# Patient Record
Sex: Male | Born: 1980 | Race: Black or African American | Hispanic: No | Marital: Married | Smoking: Current every day smoker
Health system: Southern US, Community
[De-identification: ages and names within clinical notes are randomized; demographics above are authoritative.]

## PROBLEM LIST (undated history)

## (undated) DIAGNOSIS — J45909 Unspecified asthma, uncomplicated: Secondary | ICD-10-CM

---
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
## 2005-05-31 ENCOUNTER — Emergency Department (HOSPITAL_COMMUNITY): Admission: EM | Admit: 2005-05-31 | Discharge: 2005-05-31 | Payer: Self-pay | Admitting: Family Medicine

## 2011-11-24 ENCOUNTER — Emergency Department (INDEPENDENT_AMBULATORY_CARE_PROVIDER_SITE_OTHER)
Admission: EM | Admit: 2011-11-24 | Discharge: 2011-11-24 | Disposition: A | Discharge status: Home / Self Care | Payer: BC Managed Care – PPO | Source: Home / Self Care | Attending: Family Medicine | Admitting: Family Medicine

## 2011-11-24 ENCOUNTER — Encounter (HOSPITAL_COMMUNITY): Payer: Self-pay

## 2011-11-24 ENCOUNTER — Emergency Department (INDEPENDENT_AMBULATORY_CARE_PROVIDER_SITE_OTHER): Payer: BC Managed Care – PPO

## 2011-11-24 DIAGNOSIS — S6390XA Sprain of unspecified part of unspecified wrist and hand, initial encounter: Secondary | ICD-10-CM

## 2011-11-24 DIAGNOSIS — IMO0001 Reserved for inherently not codable concepts without codable children: Secondary | ICD-10-CM

## 2011-11-24 NOTE — ED Provider Notes (Signed)
History     CSN: [id number]  Arrival date & time 11/24/11  1206   First MD Initiated Contact with Patient 11/24/11 1232      Chief Complaint  Patient presents with  . Finger Injury    (Consider location/radiation/quality/duration/timing/severity/associated sxs/prior treatment) Patient is a 31 y.o. male presenting with hand pain. The history is provided by the patient.  Hand Pain This is a new problem. Episode onset: rrf struck with basketball on sun, thinks was dislocated , self reduced, still sore and swollen. The problem has not changed since onset.   History reviewed. No pertinent past medical history.  History reviewed. No pertinent past surgical history.  History reviewed. No pertinent family history.  History  Substance Use Topics  . Smoking status: Not on file  . Smokeless tobacco: Not on file  . Alcohol Use: Not on file      Review of Systems  Constitutional: Negative.   Musculoskeletal: Positive for joint swelling.    Allergies  Review of patient's allergies indicates no known allergies.  Home Medications  No current outpatient prescriptions on file.  BP 131/72  Pulse 67  Temp(Src) 99 F (37.2 C) (Oral)  Resp 16  SpO2 100%  Physical Exam  Nursing note and vitals reviewed. Constitutional: He is oriented to person, place, and time. He appears well-developed and well-nourished.  Musculoskeletal: He exhibits edema and tenderness.       Hands: Neurological: He is alert and oriented to person, place, and time.    ED Course  Procedures (including critical care time)  Labs Reviewed - No data to display Dg Finger Ring Right  11/24/2011  *RADIOLOGY REPORT*  Clinical Data: 31 year old male with blunt trauma.  Pain.  RIGHT RING FINGER 2+V  Comparison: None.  Findings: Bone mineralization is within normal limits.  Joint spaces are within normal limits.  Alignment preserved.  No acute fracture.  IMPRESSION: No acute fracture or dislocation identified about  the right fourth finger.  Original Report Authenticated By: Harley Hallmark, M.D.     1. Sprain of fourth finger of right hand       MDM  X-rays reviewed and report per radiologist.         Linna Hoff, MD 11/29/11 2045

## 2011-11-24 NOTE — ED Notes (Signed)
I think I dislocated my finger on Sunday; c/o pain right ring finger, swelling decreased ROM DIP segment, skin intact, no significant deformity

## 2011-11-24 NOTE — Discharge Instructions (Signed)
Tape fingers for comfort as needed, see orthopedist if further problems.

## 2013-11-13 ENCOUNTER — Emergency Department (INDEPENDENT_AMBULATORY_CARE_PROVIDER_SITE_OTHER): Payer: Self-pay

## 2013-11-13 ENCOUNTER — Emergency Department (INDEPENDENT_AMBULATORY_CARE_PROVIDER_SITE_OTHER)
Admission: EM | Admit: 2013-11-13 | Discharge: 2013-11-13 | Disposition: A | Discharge status: Home / Self Care | Payer: Self-pay | Source: Home / Self Care | Attending: Emergency Medicine | Admitting: Emergency Medicine

## 2013-11-13 ENCOUNTER — Encounter (HOSPITAL_COMMUNITY): Payer: Self-pay | Admitting: Emergency Medicine

## 2013-11-13 DIAGNOSIS — X500XXA Overexertion from strenuous movement or load, initial encounter: Secondary | ICD-10-CM

## 2013-11-13 DIAGNOSIS — S93409A Sprain of unspecified ligament of unspecified ankle, initial encounter: Secondary | ICD-10-CM

## 2013-11-13 DIAGNOSIS — Y9367 Activity, basketball: Secondary | ICD-10-CM

## 2013-11-13 MED ORDER — IBUPROFEN 800 MG PO TABS
800.0000 mg | ORAL_TABLET | Freq: Once | ORAL | Status: AC
  Administered 2013-11-13: 800 mg via ORAL

## 2013-11-13 MED ORDER — IBUPROFEN 800 MG PO TABS
ORAL_TABLET | ORAL | Status: AC
  Filled 2013-11-13: qty 1

## 2013-11-13 NOTE — ED Notes (Signed)
Pt  Reports  He  Injured  His  l  Ankle  Yesterday  Playing  Basketball     He  Has    Pain /  Swelling   To the  Affected  Ankle      Pt  denys  Any other  injurys

## 2013-11-13 NOTE — ED Provider Notes (Signed)
Medical screening examination/treatment/procedure(s) were performed by non-physician practitioner and as supervising physician I was immediately available for consultation/collaboration.  Leslee Homeavid Niva Murren, M.D.  Reuben Likesavid C Gemini Beaumier, MD 11/13/13 1440

## 2013-11-13 NOTE — ED Provider Notes (Signed)
CSN: [id number]     Arrival date & time 11/13/13  [id number] History   First MD Initiated Contact with Patient 11/13/13 [phone]     Chief Complaint  Patient presents with  . Ankle Pain   (Consider location/radiation/quality/duration/timing/severity/associated sxs/prior Treatment) Patient is a 33 y.o. male presenting with ankle pain. The history is provided by the patient.  Ankle Pain Location:  Ankle Time since incident:  1 day Injury: yes   Mechanism of injury comment:  Twisted ankle while playing basketball last night Ankle location:  L ankle Pain details:    Quality:  Aching   Radiates to:  Does not radiate   Severity:  Mild Dislocation: no   Foreign body present:  No foreign bodies Prior injury to area:  No   History reviewed. No pertinent past medical history. History reviewed. No pertinent past surgical history. History reviewed. No pertinent family history. History  Substance Use Topics  . Smoking status: Not on file  . Smokeless tobacco: Not on file  . Alcohol Use: No    Review of Systems  All other systems reviewed and are negative.   Allergies  Review of patient's allergies indicates no known allergies.  Home Medications  No current outpatient prescriptions on file. BP 129/81  Pulse 60  Temp(Src) 97.3 F (36.3 C) (Oral)  Resp 16  SpO2 100% Physical Exam  Nursing note and vitals reviewed. Constitutional: He is oriented to person, place, and time. He appears well-developed and well-nourished. No distress.  HENT:  Head: Normocephalic and atraumatic.  Eyes: Conjunctivae are normal.  Cardiovascular: Normal rate.   Pulmonary/Chest: Effort normal.  Musculoskeletal: Normal range of motion.       Left ankle: He exhibits normal range of motion, no swelling, no ecchymosis, no deformity, no laceration and normal pulse. Tenderness. Lateral malleolus tenderness found. No medial malleolus tenderness found. Achilles tendon normal.  Neurological: He is alert and oriented to  person, place, and time.  Skin: Skin is warm and dry.  +intact   Psychiatric: He has a normal mood and affect. His behavior is normal.    ED Course  Procedures (including critical care time) Labs Review Labs Reviewed - No data to display Imaging Review Dg Ankle Complete Left  11/13/2013   CLINICAL DATA:  Left ankle injury.  EXAM: LEFT ANKLE COMPLETE - 3+ VIEW  COMPARISON:  None.  FINDINGS: There is no evidence of fracture, dislocation, or joint effusion. There is no evidence of arthropathy or other focal bone abnormality. Soft tissues are unremarkable.  IMPRESSION: Normal left ankle.   Electronically Signed   By: Roque LiasJames  Green M.D.   On: 11/13/2013 10:24     MDM   1. Ankle sprain   Films negative for fx. Exam consistent with mild ankle sprain. RICE, NSAIDs, ASO and crutches. Ortho follow up if no improvement over next 2-3 weeks.     Jess BartersJennifer Lee WallsPresson, GeorgiaPA 11/13/13 1055

## 2013-11-13 NOTE — Discharge Instructions (Signed)
Your xrays were normal. Splint and crutches as needed for comfort. Ice and elevate to reduce swelling. Ibuprofen as directed on packaging for pain. Follow up with orthopedist (Dr. Luiz Blare) if no improvement over the next 2-3 weeks.  Ankle Sprain An ankle sprain is an injury to the strong, fibrous tissues (ligaments) that hold the bones of your ankle joint together.  CAUSES An ankle sprain is usually caused by a fall or by twisting your ankle. Ankle sprains most commonly occur when you step on the outer edge of your foot, and your ankle turns inward. People who participate in sports are more prone to these types of injuries.  SYMPTOMS   Pain in your ankle. The pain may be present at rest or only when you are trying to stand or walk.  Swelling.  Bruising. Bruising may develop immediately or within 1 to 2 days after your injury.  Difficulty standing or walking, particularly when turning corners or changing directions. DIAGNOSIS  Your caregiver will ask you details about your injury and perform a physical exam of your ankle to determine if you have an ankle sprain. During the physical exam, your caregiver will press on and apply pressure to specific areas of your foot and ankle. Your caregiver will try to move your ankle in certain ways. An X-ray exam may be done to be sure a bone was not broken or a ligament did not separate from one of the bones in your ankle (avulsion fracture).  TREATMENT  Certain types of braces can help stabilize your ankle. Your caregiver can make a recommendation for this. Your caregiver may recommend the use of medicine for pain. If your sprain is severe, your caregiver may refer you to a surgeon who helps to restore function to parts of your skeletal system (orthopedist) or a physical therapist. HOME CARE INSTRUCTIONS   Apply ice to your injury for 1 2 days or as directed by your caregiver. Applying ice helps to reduce inflammation and pain.  Put ice in a plastic  bag.  Place a towel between your skin and the bag.  Leave the ice on for 15-20 minutes at a time, every 2 hours while you are awake.  Only take over-the-counter or prescription medicines for pain, discomfort, or fever as directed by your caregiver.  Elevate your injured ankle above the level of your heart as much as possible for 2 3 days.  If your caregiver recommends crutches, use them as instructed. Gradually put weight on the affected ankle. Continue to use crutches or a cane until you can walk without feeling pain in your ankle.  If you have a plaster splint, wear the splint as directed by your caregiver. Do not rest it on anything harder than a pillow for the first 24 hours. Do not put weight on it. Do not get it wet. You may take it off to take a shower or bath.  You may have been given an elastic bandage to wear around your ankle to provide support. If the elastic bandage is too tight (you have numbness or tingling in your foot or your foot becomes cold and blue), adjust the bandage to make it comfortable.  If you have an air splint, you may blow more air into it or let air out to make it more comfortable. You may take your splint off at night and before taking a shower or bath. Wiggle your toes in the splint several times per day to decrease swelling. SEEK MEDICAL CARE IF:  You have rapidly increasing bruising or swelling.  Your toes feel extremely cold or you lose feeling in your foot.  Your pain is not relieved with medicine. SEEK IMMEDIATE MEDICAL CARE IF:  Your toes are numb or blue.  You have severe pain that is increasing. MAKE SURE YOU:   Understand these instructions.  Will watch your condition.  Will get help right away if you are not doing well or get worse. Document Released: 07/24/2005 Document Revised: 04/17/2012 Document Reviewed: 08/05/2011 [facility] Patient Information 2014 [place].  Stirrup Ankle Brace Stirrup ankle braces give support and help  stabilize the ankle joint. They are rigid pieces of plastic or fiberglass that go up both sides of the lower leg with the bottom of the stirrup fitting comfortably under the bottom of the instep of the foot. It can be held on with Velcro straps or an elastic wrap. Stirrup ankle braces are used to support the ankle following mild or moderate sprains or strains, or fractures after cast removal.  They can be easily removed or adjusted if there is swelling. The rigid brace shells are designed to fit the ankle comfortably and provide the needed medial/lateral stabilization. This brace can be easily worn with most athletic shoes. The brace liner is usually made of a soft, comfortable gel-like material. This gel fits the ankle well without causing uncomfortable pressure points.  IMPORTANCE OF ANKLE BRACES:  The use of ankle bracing is effective in the prevention of ankle sprains.  In athletes, the use of ankle bracing will offer protection and prevent further sprains.  Research shows that a complete rehabilitation program needs to be included with external bracing. This includes range of motion and ankle strengthening exercises. Your caregivers will instruct you in this. If you were given the brace today for a new injury, use the following home care instructions as a guide. HOME CARE INSTRUCTIONS   Apply ice to the sore area for 15-20 minutes, 03-04 times per day while awake for the first 2 days. Put the ice in a plastic bag and place a towel between the bag of ice and your skin. Never place the ice pack directly on your skin. Be especially careful using ice on an elbow or knee or other bony area, such as your ankle, because icing for too long may damage the nerves which are close to the surface.  Keep your leg elevated when possible to lessen swelling.  Wear your splint until you are seen for a follow-up examination. Do not put weight on it. Do not get it wet. You may take it off to take a shower or  bath.  For Activity: Use crutches with non-weight bearing for 1 week. Then, you may walk on your ankle as instructed. Start gradually with weight bearing on the affected ankle.  Continue to use crutches or a cane until you can stand on your ankle without causing pain.  Wiggle your toes in the splint several times per day if you are able.  The splint is too tight if you have numbness, tingling, or if your foot becomes cold and blue. Adjust the straps or elastic bandage to make it comfortable.  Only take over-the-counter or prescription medicines for pain, discomfort, or fever as directed by your caregiver. SEEK IMMEDIATE MEDICAL CARE IF:   You have increased bruising, swelling or pain.  Your toes are blue or cold and loosening the brace or wrap does not help.  Your pain is not relieved with medicine. MAKE SURE  YOU:   Understand these instructions.  Will watch your condition.  Will get help right away if you are not doing well or get worse. Document Released: 05/24/2004 Document Revised: 10/16/2011 Document Reviewed: 10/26/2008 [facility] Patient Information 2014 [place], [place].

## 2013-11-13 NOTE — ED Notes (Signed)
xl  aso   Applied         And  Adult  Crutches       Fitted  To  6  1

## 2014-01-06 ENCOUNTER — Encounter (HOSPITAL_COMMUNITY): Payer: Self-pay | Admitting: Emergency Medicine

## 2014-01-06 ENCOUNTER — Emergency Department (HOSPITAL_COMMUNITY)
Admission: EM | Admit: 2014-01-06 | Discharge: 2014-01-06 | Disposition: A | Discharge status: Home / Self Care | Payer: BC Managed Care – PPO | Attending: Emergency Medicine | Admitting: Emergency Medicine

## 2014-01-06 DIAGNOSIS — A64 Unspecified sexually transmitted disease: Secondary | ICD-10-CM | POA: Insufficient documentation

## 2014-01-06 DIAGNOSIS — Z880 Allergy status to penicillin: Secondary | ICD-10-CM | POA: Insufficient documentation

## 2014-01-06 DIAGNOSIS — R599 Enlarged lymph nodes, unspecified: Secondary | ICD-10-CM | POA: Insufficient documentation

## 2014-01-06 HISTORY — DX: Unspecified asthma, uncomplicated: J45.909

## 2014-01-06 LAB — URINE MICROSCOPIC-ADD ON

## 2014-01-06 LAB — URINALYSIS, ROUTINE W REFLEX MICROSCOPIC
Bilirubin Urine: NEGATIVE
Glucose, UA: NEGATIVE mg/dL
HGB URINE DIPSTICK: NEGATIVE
KETONES UR: NEGATIVE mg/dL
NITRITE: NEGATIVE
PH: 8 (ref 5.0–8.0)
Protein, ur: 30 mg/dL — AB
SPECIFIC GRAVITY, URINE: 1.02 (ref 1.005–1.030)
UROBILINOGEN UA: 1 mg/dL (ref 0.0–1.0)

## 2014-01-06 MED ORDER — CEFTRIAXONE SODIUM 250 MG IJ SOLR
250.0000 mg | Freq: Once | INTRAMUSCULAR | Status: AC
  Administered 2014-01-06: 250 mg via INTRAMUSCULAR
  Filled 2014-01-06: qty 250

## 2014-01-06 MED ORDER — AZITHROMYCIN 250 MG PO TABS
1000.0000 mg | ORAL_TABLET | Freq: Once | ORAL | Status: AC
  Administered 2014-01-06: 1000 mg via ORAL
  Filled 2014-01-06: qty 4

## 2014-01-06 MED ORDER — LIDOCAINE HCL 1 % IJ SOLN
INTRAMUSCULAR | Status: AC
  Administered 2014-01-06: 2.1 mL
  Filled 2014-01-06: qty 20

## 2014-01-06 MED ORDER — METRONIDAZOLE 500 MG PO TABS
2000.0000 mg | ORAL_TABLET | Freq: Once | ORAL | Status: AC
  Administered 2014-01-06: 2000 mg via ORAL
  Filled 2014-01-06: qty 4

## 2014-01-06 NOTE — ED Provider Notes (Signed)
Medical screening examination/treatment/procedure(s) were performed by non-physician practitioner and as supervising physician I was immediately available for consultation/collaboration.   EKG Interpretation None        Secilia Apps, MD 01/06/14 2336 

## 2014-01-06 NOTE — ED Notes (Signed)
Pt reports noticed blood in urine, frequent urination, and dysuria 2 days ago.

## 2014-01-06 NOTE — Discharge Instructions (Signed)
You were diagnosed and treated for a sexual transmitted disease. Your test results should be back in one to 2 days. Please notify all of your sexual partners of your diagnosis of a sexually transmitted disease and any other positive test results so they may also be evaluated and treated. It is [facility] a recheck of your infection to be sure it is treated in 1-2 weeks.    Sexually Transmitted Disease A sexually transmitted disease (STD) is a disease or infection that may be passed (transmitted) from person to person, usually during sexual activity. This may happen by way of saliva, semen, blood, vaginal mucus, or urine. Common STDs include:   Gonorrhea.   Chlamydia.   Syphilis.   HIV and AIDS.   Genital herpes.   Hepatitis B and C.   Trichomonas.   Human papillomavirus (HPV).   Pubic lice.   Scabies.  Mites.  Bacterial vaginosis. WHAT ARE CAUSES OF STDs? An STD may be caused by bacteria, a virus, or parasites. STDs are often transmitted during sexual activity if one person is infected. However, they may also be transmitted through nonsexual means. STDs may be transmitted after:   Sexual intercourse with an infected person.   Sharing sex toys with an infected person.   Sharing needles with an infected person or using unclean piercing or tattoo needles.  Having intimate contact with the genitals, mouth, or rectal areas of an infected person.   Exposure to infected fluids during birth. WHAT ARE THE SIGNS AND SYMPTOMS OF STDs? Different STDs have different symptoms. Some people may not have any symptoms. If symptoms are present, they may include:   Painful or bloody urination.   Pain in the pelvis, abdomen, vagina, anus, throat, or eyes.   Skin rash, itching, irritation, growths, sores (lesions), ulcerations, or warts in the genital or anal area.  Abnormal vaginal discharge with or without  bad odor.   Penile discharge in men.   Fever.   Pain or bleeding during sexual intercourse.   Swollen glands in the groin area.   Yellow skin and eyes (jaundice). This is seen with hepatitis.   Swollen testicles.  Infertility.  Sores and blisters in the mouth. HOW ARE STDs DIAGNOSED? To make a diagnosis, your health care provider may:   Take a medical history.   Perform a physical exam.   Take a sample of any discharge for examination.  Swab the throat, cervix, opening to the penis, rectum, or vagina for testing.  Test a sample of your first morning urine.   Perform blood tests.   Perform a Pap smear, if this applies.   Perform a colposcopy.   Perform a laparoscopy.  HOW ARE STDs TREATED? Treatment depends on the STD. Some STDs may be treated but not cured.   Chlamydia, gonorrhea, trichomonas, and syphilis can be cured with antibiotics.   Genital herpes, hepatitis, and HIV can be treated, but not cured, with prescribed medicines. The medicines lessen symptoms.   Genital warts from HPV can be treated with medicine or by freezing, burning (electrocautery), or surgery. Warts may come back.   HPV cannot be cured with medicine or surgery. However, abnormal areas may be removed from the cervix, vagina, or vulva.   If your diagnosis is confirmed, your recent sexual partners need treatment. This is true even if they are symptom-free or have a negative culture or evaluation. They should not have sex until their health care  providers say it is OK. HOW CAN I REDUCE MY RISK OF GETTING AN STD?  Use latex condoms, dental dams, and water-soluble lubricants during sexual activity. Do not use petroleum jelly or oils.  Get vaccinated for HPV and hepatitis. If you have not received these vaccines in the past, talk to your health care provider about whether one or both might be right for you.   Avoid risky sex practices that can break the skin.  WHAT SHOULD I  DO IF I THINK I HAVE AN STD?  See your health care provider.   Inform all sexual partners. They should be tested and treated for any STDs.  Do not have sex until your health care provider says it is OK. WHEN SHOULD I GET HELP? Seek immediate medical care if:  You develop severe abdominal pain.  You are a man and notice swelling or pain in the testicles.  You are a woman and notice swelling or pain in your vagina. Document Released: 10/14/2002 Document Revised: 05/14/2013 Document Reviewed: 02/11/2013 [facility] Patient Information 2014 [place], [place].

## 2014-01-06 NOTE — ED Provider Notes (Signed)
CSN: [id number]     Arrival date & time 01/06/14  2021 History  This chart was scribed for non-physician practitioner, Ivonne AndrewPeter Chord Takahashi, PA-C working with Rolan BuccoMelanie Belfi, MD by Greggory StallionKayla Andersen, ED scribe. This patient was seen in room WTR7/WTR7 and the patient's care was started at 10:13 PM.   Chief Complaint  Patient presents with  . Hematuria   The history is provided by the patient. No language interpreter was used.   HPI Comments: Isaiah Smith is a [age] y.o. male who presents to the Emergency Department complaining of hematuria, dysuria, penile discharge and urinary frequency that started 2 days ago. States he noticed the blood before the burning. Pt is currently sexually active and has more than one partner. He states he sometimes use protection but not all the time. Denies fever, chills, sweats, nausea, emesis, back pain, flank pain. Denies past history of STD. Denies family history of kidney stones.   History reviewed. No pertinent past medical history. History reviewed. No pertinent past surgical history. No family history on file. History  Substance Use Topics  . Smoking status: Not on file  . Smokeless tobacco: Not on file  . Alcohol Use: No    Review of Systems  Constitutional: Negative for fever and chills.  Gastrointestinal: Negative for nausea and vomiting.  Genitourinary: Positive for dysuria, frequency, hematuria and discharge.  Musculoskeletal: Negative for back pain.  All other systems reviewed and are negative.  Allergies  Penicillins  Home Medications   Prior to Admission medications   Not on File   BP 126/71  Pulse 73  Temp(Src) 98.7 F (37.1 C) (Oral)  Resp 14  Ht 6\' 2"  (1.88 m)  Wt 185 lb (83.915 kg)  BMI 23.74 kg/m2  SpO2 100%  Physical Exam  Nursing note and vitals reviewed. Constitutional: He is oriented to person, place, and time. He appears well-developed and well-nourished. No distress.  HENT:  Head: Normocephalic and atraumatic.  Eyes:  Conjunctivae and EOM are normal.  Neck: Normal range of motion. No tracheal deviation present.  Cardiovascular: Normal rate.   Pulmonary/Chest: Effort normal. No respiratory distress.  Genitourinary: Testes normal. Right testis shows no mass and no tenderness. Left testis shows no mass and no tenderness. Circumcised. Discharge found.  No rashes or lesions. Tender lymphadenopathy. Whiteish green discharge noted. Normal epididymus. Non tender.   Musculoskeletal: Normal range of motion.  Lymphadenopathy:       Right: Inguinal adenopathy present.       Left: Inguinal adenopathy present.  Neurological: He is alert and oriented to person, place, and time.  Skin: Skin is warm and dry.  Psychiatric: He has a normal mood and affect. His behavior is normal.    ED Course  Procedures   DIAGNOSTIC STUDIES: Oxygen Saturation is 100% on RA, normal by my interpretation.    COORDINATION OF CARE: 10:15 PM-Discussed treatment plan which includes UA , STD testing and treatment with pt at bedside and pt agreed to plan.   Results for orders placed during the hospital encounter of 01/06/14  URINALYSIS, ROUTINE W REFLEX MICROSCOPIC      Result Value Ref Range   Color, Urine YELLOW  YELLOW   APPearance CLOUDY (*) CLEAR   Specific Gravity, Urine 1.020  1.005 - 1.030   pH 8.0  5.0 - 8.0   Glucose, UA NEGATIVE  NEGATIVE mg/dL   Hgb urine dipstick NEGATIVE  NEGATIVE   Bilirubin Urine NEGATIVE  NEGATIVE   Ketones, ur NEGATIVE  NEGATIVE mg/dL  Protein, ur 30 (*) NEGATIVE mg/dL   Urobilinogen, UA 1.0  0.0 - 1.0 mg/dL   Nitrite NEGATIVE  NEGATIVE   Leukocytes, UA LARGE (*) NEGATIVE  URINE MICROSCOPIC-ADD ON      Result Value Ref Range   WBC, UA TOO NUMEROUS TO COUNT  <3 WBC/hpf   RBC / HPF 0-2  <3 RBC/hpf   Bacteria, UA RARE  RARE       MDM   Final diagnoses:  STD (male)      I personally performed the services described in this documentation, which was scribed in my presence. The recorded  information has been reviewed and is accurate.  Angus Seller, PA-C 01/06/14 2244

## 2014-01-07 LAB — GC/CHLAMYDIA PROBE AMP
CT PROBE, AMP APTIMA: NEGATIVE
GC PROBE AMP APTIMA: POSITIVE — AB

## 2014-01-07 LAB — RPR

## 2014-01-07 LAB — HIV ANTIBODY (ROUTINE TESTING W REFLEX): HIV 1&2 Ab, 4th Generation: NONREACTIVE

## 2014-01-08 ENCOUNTER — Telehealth (HOSPITAL_BASED_OUTPATIENT_CLINIC_OR_DEPARTMENT_OTHER): Payer: Self-pay | Admitting: Emergency Medicine

## 2014-01-08 LAB — URINE CULTURE
CULTURE: NO GROWTH
Colony Count: NO GROWTH

## 2014-01-08 NOTE — Telephone Encounter (Signed)
+  Gonorrhea. Patient treated with Rocephin and Zithromax. [employer] faxed. 

## 2015-05-23 IMAGING — CR DG ANKLE COMPLETE 3+V*L*
3 series · 3 of 3 positions shown · non-contrast
Comparison: None.

CLINICAL DATA: Left ankle injury.

EXAM:
LEFT ANKLE COMPLETE - 3+ VIEW

[view not recorded (1 of 3)]
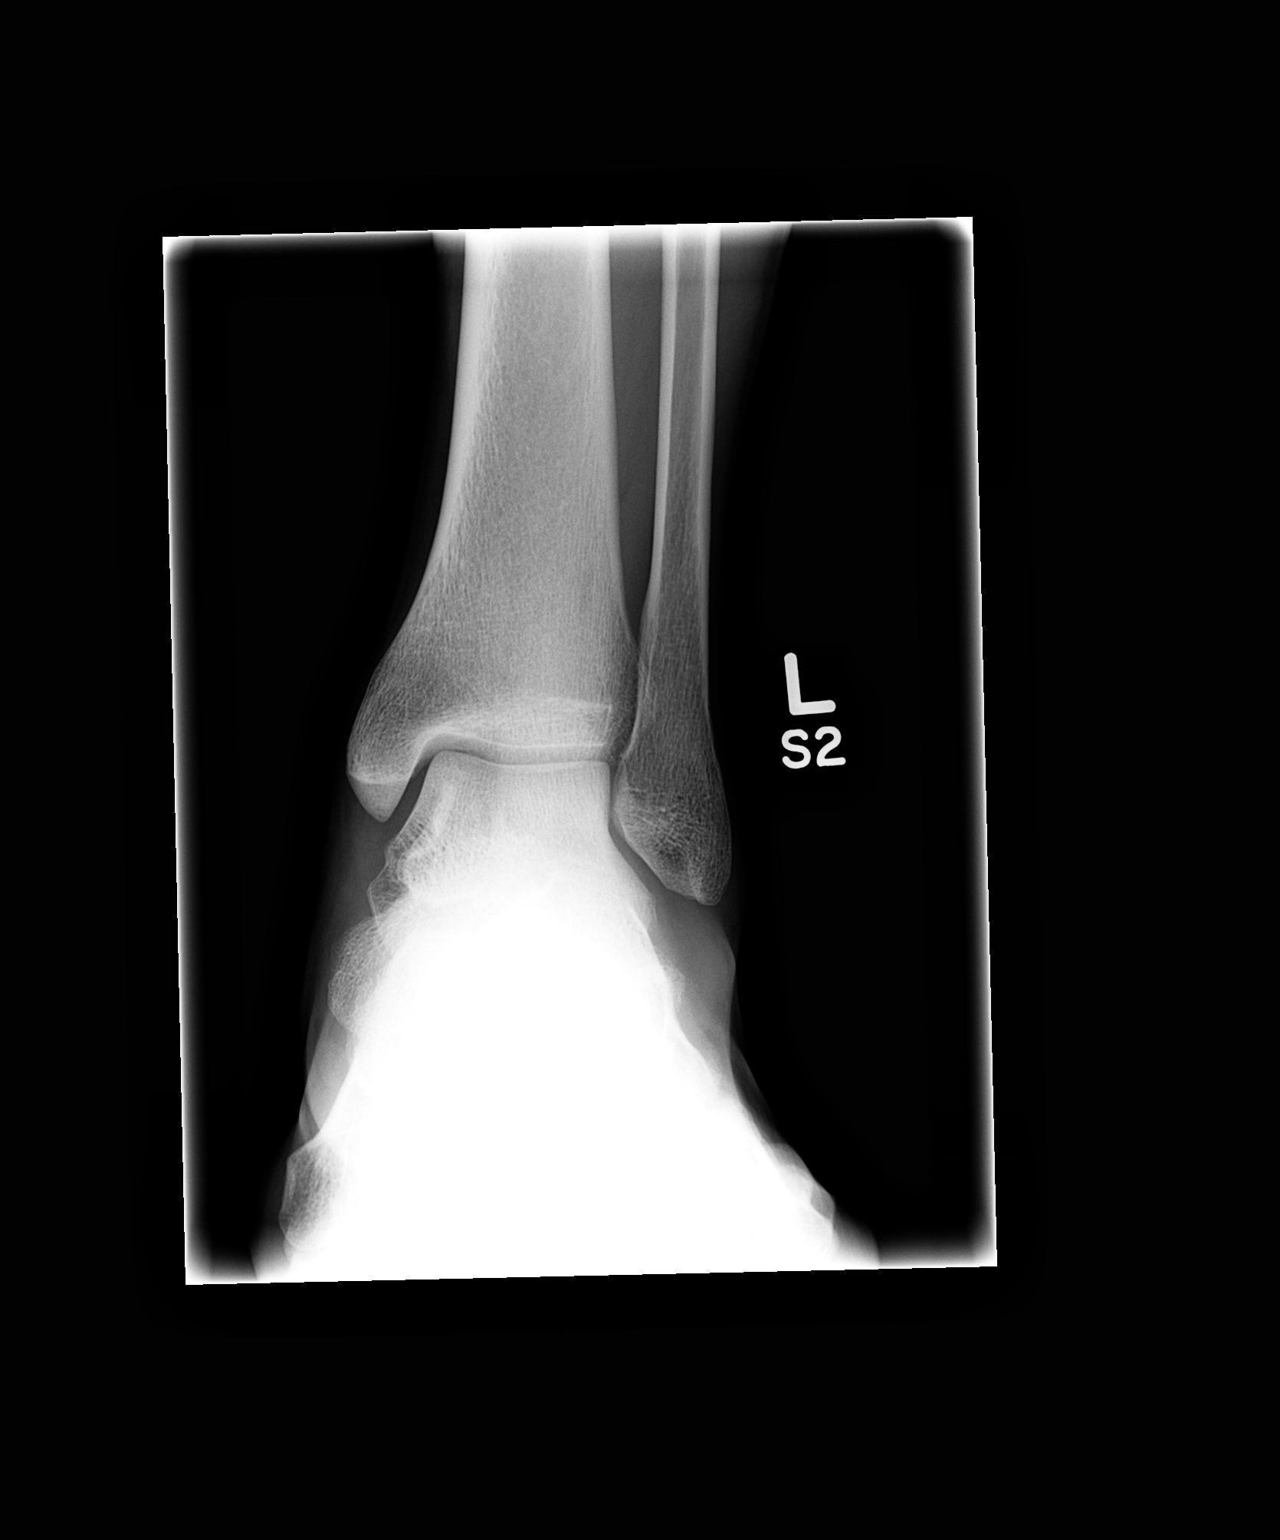

[view not recorded (2 of 3)]
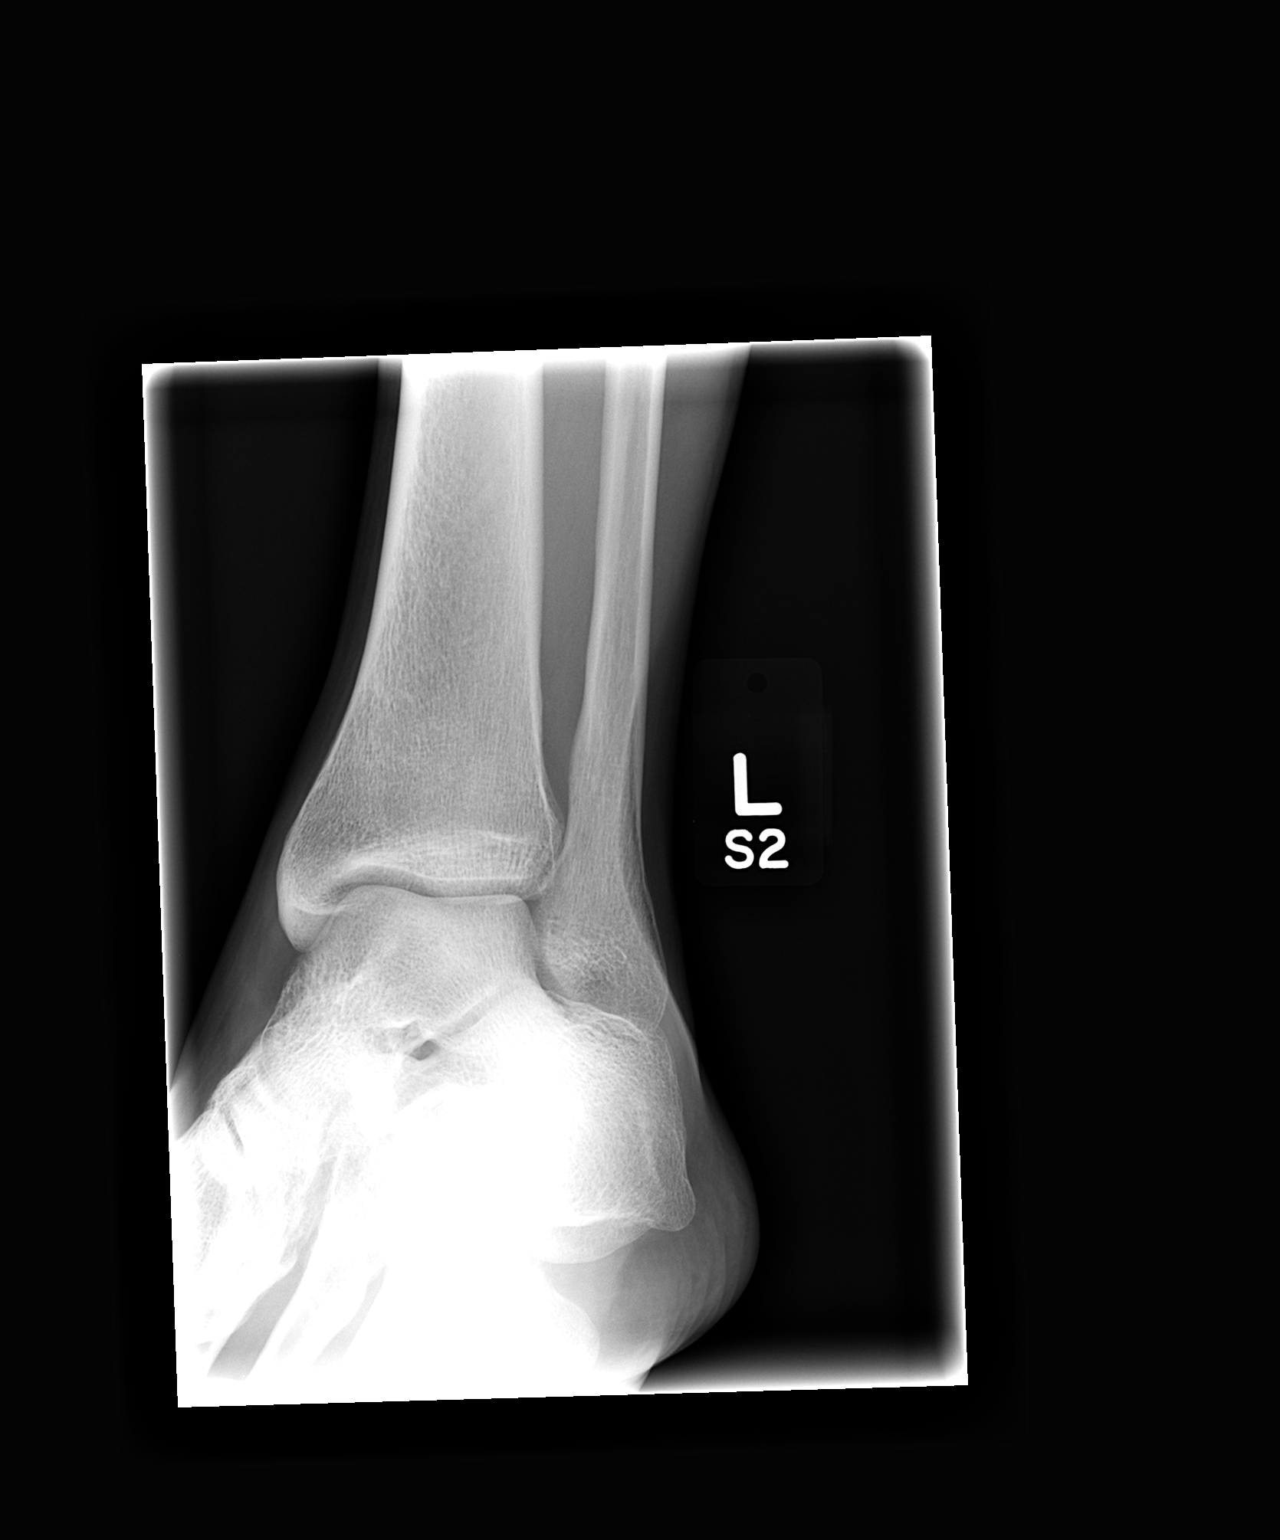

[view not recorded (3 of 3)]
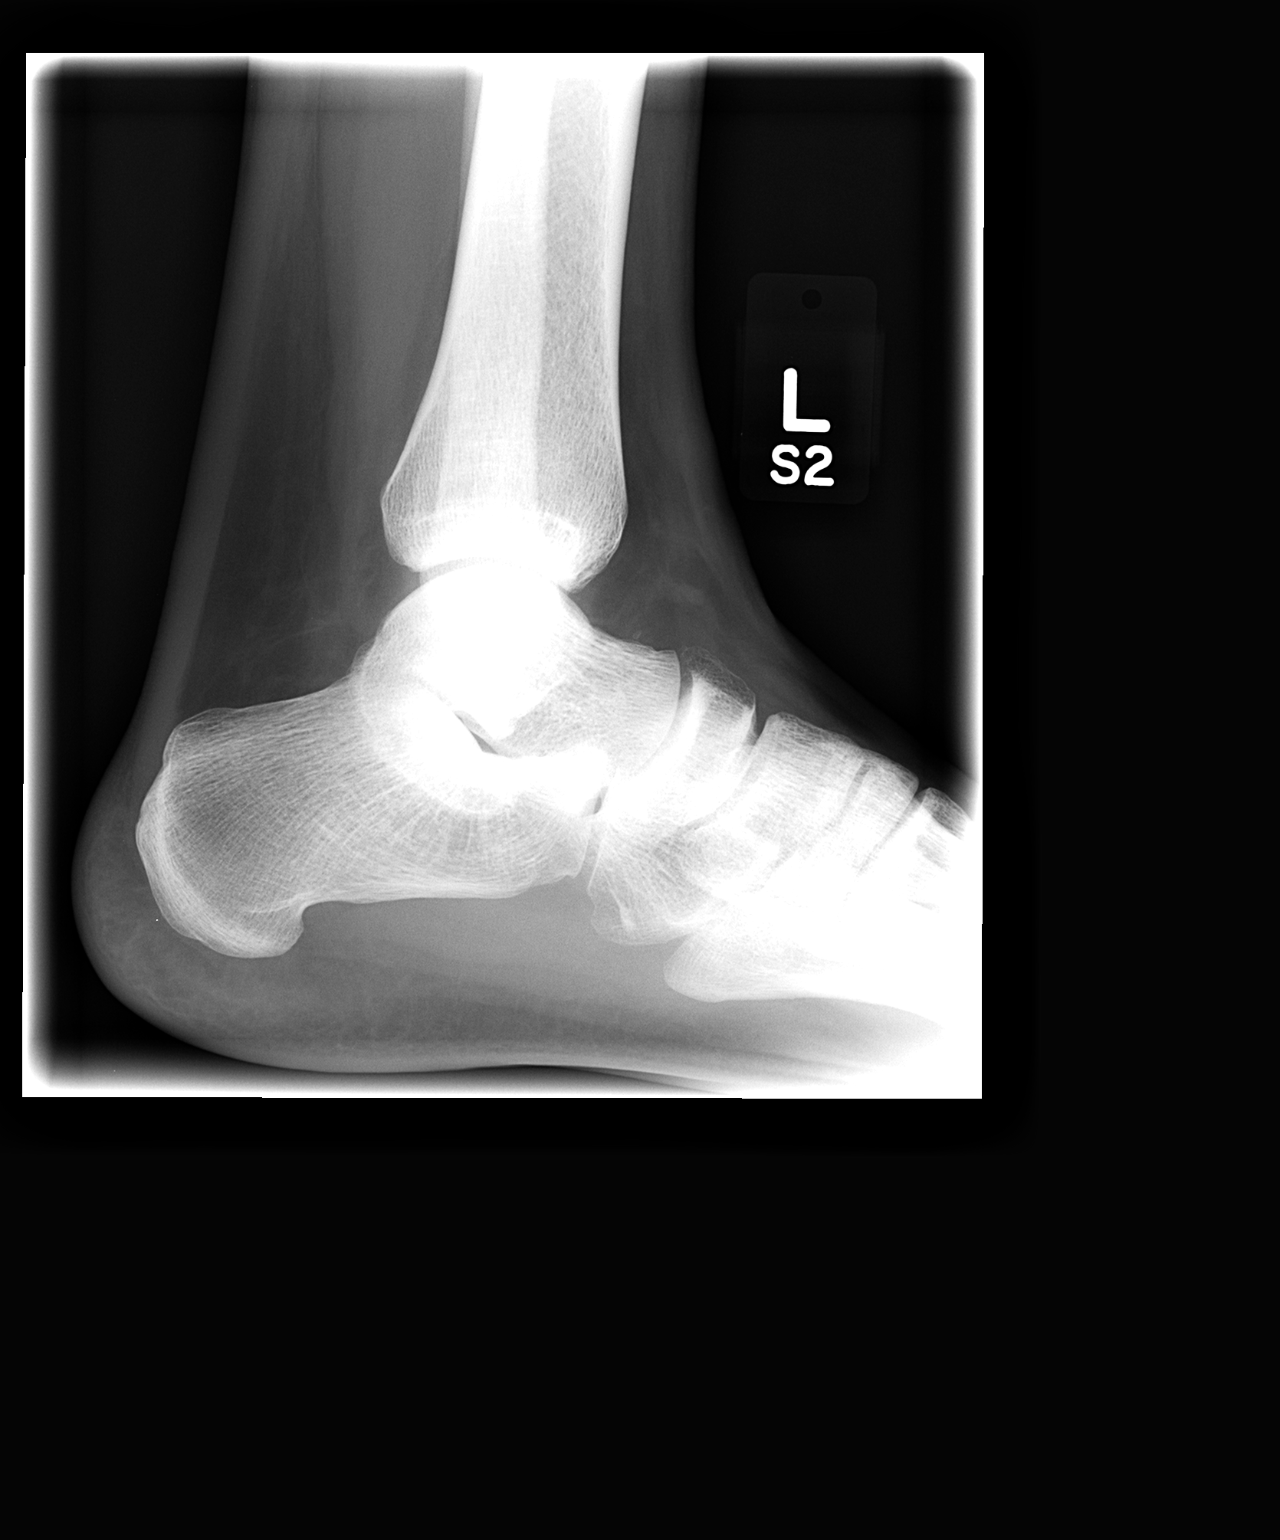

[3 of 3 positions shown; findings below may reference images not displayed]

FINDINGS: There is no evidence of fracture, dislocation, or joint effusion.
There is no evidence of arthropathy or other focal bone abnormality.
Soft tissues are unremarkable.
IMPRESSION: Normal left ankle.

## 2019-05-05 ENCOUNTER — Other Ambulatory Visit: Payer: Self-pay

## 2019-05-05 ENCOUNTER — Ambulatory Visit (HOSPITAL_COMMUNITY)
Admission: EM | Admit: 2019-05-05 | Discharge: 2019-05-05 | Disposition: A | Discharge status: Home / Self Care | Payer: Managed Care, Other (non HMO) | Attending: Family Medicine | Admitting: Family Medicine

## 2019-05-05 ENCOUNTER — Encounter (HOSPITAL_COMMUNITY): Payer: Self-pay

## 2019-05-05 DIAGNOSIS — Z20828 Contact with and (suspected) exposure to other viral communicable diseases: Secondary | ICD-10-CM | POA: Insufficient documentation

## 2019-05-05 DIAGNOSIS — Z20822 Contact with and (suspected) exposure to covid-19: Secondary | ICD-10-CM

## 2019-05-05 NOTE — ED Provider Notes (Signed)
[facility]    CSN: [id number] Arrival date & time: 05/05/19  8756      History   Chief Complaint Chief Complaint  Patient presents with  . Covid Testing    HPI Isaiah Smith is a 38 y.o. male history of asthma presenting today for evaluation of COVID testing.  Patient is presenting here with his brother who is also here for testing.  They recently traveled to [facility] via airplane.  They are hoping for testing given the recent travel in order to feel safe around their family.  He has not had any symptoms.  Declining any URI symptoms of cough, congestion or sore throat.  Denies fevers chills or body aches.  Denies nausea vomiting or diarrhea.  HPI  Past Medical History:  Diagnosis Date  . Asthma     There are no active problems to display for this patient.   Past Surgical History:  Procedure Laterality Date  . HERNIA REPAIR  1999       Home Medications    Prior to Admission medications   Not on File    Family History Family History  Family history unknown: Yes    Social History Social History   Tobacco Use  . Smoking status: Current Every Day Smoker    Packs/day: 0.50    Types: Cigarettes  Substance Use Topics  . Alcohol use: Yes    Comment: occasional  . Drug use: No     Allergies   Penicillins   Review of Systems Review of Systems  Constitutional: Negative for activity change, appetite change, chills, fatigue and fever.  HENT: Negative for congestion, ear pain, rhinorrhea, sinus pressure, sore throat and trouble swallowing.   Eyes: Negative for discharge and redness.  Respiratory: Negative for cough, chest tightness and shortness of breath.   Cardiovascular: Negative for chest pain.  Gastrointestinal: Negative for abdominal pain, diarrhea, nausea and vomiting.  Musculoskeletal: Negative for myalgias.  Skin: Negative for rash.  Neurological: Negative for dizziness, light-headedness and headaches.     Physical Exam  Triage Vital Signs ED Triage Vitals  Enc Vitals Group     BP      Pulse      Resp      Temp      Temp src      SpO2      Weight      Height      Head Circumference      Peak Flow      Pain Score      Pain Loc      Pain Edu?      Excl. in [place]?    No data found.  Updated Vital Signs BP 122/76 (BP Location: Right Arm)   Pulse 83   Temp 98.4 F (36.9 C) (Oral)   Resp 18   SpO2 94%   Visual Acuity Right Eye Distance:   Left Eye Distance:   Bilateral Distance:    Right Eye Near:   Left Eye Near:    Bilateral Near:     Physical Exam Vitals signs and nursing note reviewed.  Constitutional:      Appearance: He is well-developed.     Comments: No acute distress  HENT:     Head: Normocephalic and atraumatic.     Nose: Nose normal.  Eyes:     Conjunctiva/sclera: Conjunctivae normal.  Neck:     Musculoskeletal: Neck supple.  Cardiovascular:     Rate and Rhythm: Normal rate.  Pulmonary:     Effort: Pulmonary effort is normal. No respiratory distress.  Abdominal:     General: There is no distension.  Musculoskeletal: Normal range of motion.  Skin:    General: Skin is warm and dry.  Neurological:     Mental Status: He is alert and oriented to person, place, and time.      UC Treatments / Results  Labs (all labs ordered are listed, but only abnormal results are displayed) Labs Reviewed  NOVEL CORONAVIRUS, NAA (HOSP ORDER, SEND-OUT TO REF LAB; TAT 18-24 HRS)    EKG   Radiology No results found.  Procedures Procedures (including critical care time)  Medications Ordered in UC Medications - No data to display  Initial Impression / Assessment and Plan / UC Course  I have reviewed the triage vital signs and the nursing notes.  Pertinent labs & imaging results that were available during my care of the patient were reviewed by me and considered in my medical decision making (see chart for details).     Possible COVID exposure from travel.  Currently  asymptomatic.  Screening COVID swab today.  Discussed recommendations.Discussed strict return precautions. Patient verbalized understanding and is agreeable with plan.  Final Clinical Impressions(s) / UC Diagnoses   Final diagnoses:  Exposure to Covid-19 Virus     Discharge Instructions        Person Under Monitoring Name: Isaiah Smith  Location: [address] [place] [address]   Infection Prevention Recommendations for Individuals Confirmed to have, or Being Evaluated for, 2019 Novel Coronavirus (COVID-19) Infection Who Receive Care at Home  Individuals who are confirmed to have, or are being evaluated for, COVID-19 should follow the prevention steps below until a healthcare provider or local or state health department says they can return to normal activities.  Stay home except to get medical care You should restrict activities outside your home, except for getting medical care. Do not go to work, school, or public areas, and do not use public transportation or taxis.  Call ahead before visiting your doctor Before your medical appointment, call the healthcare provider and tell them that you have, or are being evaluated for, COVID-19 infection. This will help the healthcare provider's office take steps to keep other people from getting infected. Ask your healthcare provider to call the local or state health department.  Monitor your symptoms Seek prompt medical attention if your illness is worsening (e.g., difficulty breathing). Before going to your medical appointment, call the healthcare provider and tell them that you have, or are being evaluated for, COVID-19 infection. Ask your healthcare provider to call the local or state health department.  Wear a facemask You should wear a facemask that covers your nose and mouth when you are in the same room with other people and when you visit a healthcare provider. People who live with or visit you should also wear  a facemask while they are in the same room with you.  Separate yourself from other people in your home As much as possible, you should stay in a different room from other people in your home. Also, you should use a separate bathroom, if available.  Avoid sharing household items You should not share dishes, drinking glasses, cups, eating utensils, towels, bedding, or other items with other people in your home. After using these items, you should wash them thoroughly with soap and water.  Cover your coughs and sneezes Cover your mouth and nose with a tissue  when you cough or sneeze, or you can cough or sneeze into your sleeve. Throw used tissues in a lined trash can, and immediately wash your hands with soap and water for at least 20 seconds or use an alcohol-based hand rub.  Wash your [employer] your hands often and thoroughly with soap and water for at least 20 seconds. You can use an alcohol-based hand sanitizer if soap and water are not available and if your hands are not visibly dirty. Avoid touching your eyes, nose, and mouth with unwashed hands.   Prevention Steps for Caregivers and Household Members of Individuals Confirmed to have, or Being Evaluated for, COVID-19 Infection Being Cared for in the Home  If you live with, or provide care at home for, a person confirmed to have, or being evaluated for, COVID-19 infection please follow these guidelines to prevent infection:  Follow healthcare provider's instructions Make sure that you understand and can help the patient follow any healthcare provider instructions for all care.  Provide for the patient's basic needs You should help the patient with basic needs in the home and provide support for getting groceries, prescriptions, and other personal needs.  Monitor the patient's symptoms If they are getting sicker, call his or her medical provider and tell them that the patient has, or is being evaluated for, COVID-19 infection.  This will help the healthcare provider's office take steps to keep other people from getting infected. Ask the healthcare provider to call the local or state health department.  Limit the number of people who have contact with the patient  If possible, have only one caregiver for the patient.  Other household members should stay in another home or place of residence. If this is not possible, they should stay  in another room, or be separated from the patient as much as possible. Use a separate bathroom, if available.  Restrict visitors who do not have an essential need to be in the home.  Keep older adults, very young children, and other sick people away from the patient Keep older adults, very young children, and those who have compromised immune systems or chronic health conditions away from the patient. This includes people with chronic heart, lung, or kidney conditions, diabetes, and cancer.  Ensure good ventilation Make sure that shared spaces in the home have good air flow, such as from an air conditioner or an opened window, weather permitting.  Wash your hands often  Wash your hands often and thoroughly with soap and water for at least 20 seconds. You can use an alcohol based hand sanitizer if soap and water are not available and if your hands are not visibly dirty.  Avoid touching your eyes, nose, and mouth with unwashed hands.  Use disposable paper towels to dry your hands. If not available, use dedicated cloth towels and replace them when they become wet.  Wear a facemask and gloves  Wear a disposable facemask at all times in the room and gloves when you touch or have contact with the patient's blood, body fluids, and/or secretions or excretions, such as sweat, saliva, sputum, nasal mucus, vomit, urine, or feces.  Ensure the mask fits over your nose and mouth tightly, and do not touch it during use.  Throw out disposable facemasks and gloves after using them. Do not reuse.   Wash your hands immediately after removing your facemask and gloves.  If your personal clothing becomes contaminated, carefully remove clothing and launder. Wash your hands after handling contaminated clothing.  Place all used disposable facemasks, gloves, and other waste in a lined container before disposing them with other household waste.  Remove gloves and wash your hands immediately after handling these items.  Do not share dishes, glasses, or other household items with the patient  Avoid sharing household items. You should not share dishes, drinking glasses, cups, eating utensils, towels, bedding, or other items with a patient who is confirmed to have, or being evaluated for, COVID-19 infection.  After the person uses these items, you should wash them thoroughly with soap and water.  Wash laundry thoroughly  Immediately remove and wash clothes or bedding that have blood, body fluids, and/or secretions or excretions, such as sweat, saliva, sputum, nasal mucus, vomit, urine, or feces, on them.  Wear gloves when handling laundry from the patient.  Read and follow directions on labels of laundry or clothing items and detergent. In general, wash and dry with the warmest temperatures recommended on the label.  Clean all areas the individual has used often  Clean all touchable surfaces, such as counters, tabletops, doorknobs, bathroom fixtures, toilets, phones, keyboards, tablets, and bedside tables, every day. Also, clean any surfaces that may have blood, body fluids, and/or secretions or excretions on them.  Wear gloves when cleaning surfaces the patient has come in contact with.  Use a diluted bleach solution (e.g., dilute bleach with 1 part bleach and 10 parts water) or a household disinfectant with a label that says EPA-registered for coronaviruses. To make a bleach solution at home, add 1 tablespoon of bleach to 1 quart (4 cups) of water. For a larger supply, add  cup of bleach  to 1 gallon (16 cups) of water.  Read labels of cleaning products and follow recommendations provided on product labels. Labels contain instructions for safe and effective use of the cleaning product including precautions you should take when applying the product, such as wearing gloves or eye protection and making sure you have good ventilation during use of the product.  Remove gloves and wash hands immediately after cleaning.  Monitor yourself for signs and symptoms of illness Caregivers and household members are considered close contacts, should monitor their health, and will be asked to limit movement outside of the home to the extent possible. Follow the monitoring steps for close contacts listed on the symptom monitoring form.   ? If you have additional questions, contact your local health department or call the epidemiologist on call at [phone] (available 24/7). ? This guidance is subject to change. For the most up-to-date guidance from [facility], please refer to their website: TripMetro.huhttps://www.cdc.gov/coronavirus/2019-ncov/hcp/guidance-prevent-spread.html     ED Prescriptions    None     PDMP not reviewed this encounter.   Lew DawesWieters, Enez Monahan C, [place] JerseyPA-C 05/05/19 1948

## 2019-05-05 NOTE — Discharge Instructions (Signed)
Person Under Monitoring Name: Isaiah Smith  Location: [address] [place] [address]   Infection Prevention Recommendations for Individuals Confirmed to have, or Being Evaluated for, 2019 Novel Coronavirus (COVID-19) Infection Who Receive Care at Home  Individuals who are confirmed to have, or are being evaluated for, COVID-19 should follow the prevention steps below until a healthcare provider or local or state health department says they can return to normal activities.  Stay home except to get medical care You should restrict activities outside your home, except for getting medical care. Do not go to work, school, or public areas, and do not use public transportation or taxis.  Call ahead before visiting your doctor Before your medical appointment, call the healthcare provider and tell them that you have, or are being evaluated for, COVID-19 infection. This will help the healthcare providers office take steps to keep other people from getting infected. Ask your healthcare provider to call the local or state health department.  Monitor your symptoms Seek prompt medical attention if your illness is worsening (e.g., difficulty breathing). Before going to your medical appointment, call the healthcare provider and tell them that you have, or are being evaluated for, COVID-19 infection. Ask your healthcare provider to call the local or state health department.  Wear a facemask You should wear a facemask that covers your nose and mouth when you are in the same room with other people and when you visit a healthcare provider. People who live with or visit you should also wear a facemask while they are in the same room with you.  Separate yourself from other people in your home As much as possible, you should stay in a different room from other people in your home. Also, you should use a separate bathroom, if available.  Avoid sharing household items You should not  share dishes, drinking glasses, cups, eating utensils, towels, bedding, or other items with other people in your home. After using these items, you should wash them thoroughly with soap and water.  Cover your coughs and sneezes Cover your mouth and nose with a tissue when you cough or sneeze, or you can cough or sneeze into your sleeve. Throw used tissues in a lined trash can, and immediately wash your hands with soap and water for at least 20 seconds or use an alcohol-based hand rub.  Wash your [employer] your hands often and thoroughly with soap and water for at least 20 seconds. You can use an alcohol-based hand sanitizer if soap and water are not available and if your hands are not visibly dirty. Avoid touching your eyes, nose, and mouth with unwashed hands.   Prevention Steps for Caregivers and Household Members of Individuals Confirmed to have, or Being Evaluated for, COVID-19 Infection Being Cared for in the Home  If you live with, or provide care at home for, a person confirmed to have, or being evaluated for, COVID-19 infection please follow these guidelines to prevent infection:  Follow healthcare providers instructions Make sure that you understand and can help the patient follow any healthcare provider instructions for all care.  Provide for the patients basic needs You should help the patient with basic needs in the home and provide support for getting groceries, prescriptions, and other personal needs.  Monitor the patients symptoms If they are getting sicker, call his or her medical provider and tell them that the patient has, or is being evaluated for, COVID-19 infection. This will help the healthcare  providers office take steps to keep other people from getting infected. Ask the healthcare provider to call the local or state health department.  Limit the number of people who have contact with the patient If possible, have only one caregiver for the  patient. Other household members should stay in another home or place of residence. If this is not possible, they should stay in another room, or be separated from the patient as much as possible. Use a separate bathroom, if available. Restrict visitors who do not have an essential need to be in the home.  Keep older adults, very young children, and other sick people away from the patient Keep older adults, very young children, and those who have compromised immune systems or chronic health conditions away from the patient. This includes people with chronic heart, lung, or kidney conditions, diabetes, and cancer.  Ensure good ventilation Make sure that shared spaces in the home have good air flow, such as from an air conditioner or an opened window, weather permitting.  Wash your hands often Wash your hands often and thoroughly with soap and water for at least 20 seconds. You can use an alcohol based hand sanitizer if soap and water are not available and if your hands are not visibly dirty. Avoid touching your eyes, nose, and mouth with unwashed hands. Use disposable paper towels to dry your hands. If not available, use dedicated cloth towels and replace them when they become wet.  Wear a facemask and gloves Wear a disposable facemask at all times in the room and gloves when you touch or have contact with the patients blood, body fluids, and/or secretions or excretions, such as sweat, saliva, sputum, nasal mucus, vomit, urine, or feces.  Ensure the mask fits over your nose and mouth tightly, and do not touch it during use. Throw out disposable facemasks and gloves after using them. Do not reuse. Wash your hands immediately after removing your facemask and gloves. If your personal clothing becomes contaminated, carefully remove clothing and launder. Wash your hands after handling contaminated clothing. Place all used disposable facemasks, gloves, and other waste in a lined container before  disposing them with other household waste. Remove gloves and wash your hands immediately after handling these items.  Do not share dishes, glasses, or other household items with the patient Avoid sharing household items. You should not share dishes, drinking glasses, cups, eating utensils, towels, bedding, or other items with a patient who is confirmed to have, or being evaluated for, COVID-19 infection. After the person uses these items, you should wash them thoroughly with soap and water.  Wash laundry thoroughly Immediately remove and wash clothes or bedding that have blood, body fluids, and/or secretions or excretions, such as sweat, saliva, sputum, nasal mucus, vomit, urine, or feces, on them. Wear gloves when handling laundry from the patient. Read and follow directions on labels of laundry or clothing items and detergent. In general, wash and dry with the warmest temperatures recommended on the label.  Clean all areas the individual has used often Clean all touchable surfaces, such as counters, tabletops, doorknobs, bathroom fixtures, toilets, phones, keyboards, tablets, and bedside tables, every day. Also, clean any surfaces that may have blood, body fluids, and/or secretions or excretions on them. Wear gloves when cleaning surfaces the patient has come in contact with. Use a diluted bleach solution (e.g., dilute bleach with 1 part bleach and 10 parts water) or a household disinfectant with a label that says EPA-registered for coronaviruses. To make  a bleach solution at home, add 1 tablespoon of bleach to 1 quart (4 cups) of water. For a larger supply, add  cup of bleach to 1 gallon (16 cups) of water. Read labels of cleaning products and follow recommendations provided on product labels. Labels contain instructions for safe and effective use of the cleaning product including precautions you should take when applying the product, such as wearing gloves or eye protection and making sure you  have good ventilation during use of the product. Remove gloves and wash hands immediately after cleaning.  Monitor yourself for signs and symptoms of illness Caregivers and household members are considered close contacts, should monitor their health, and will be asked to limit movement outside of the home to the extent possible. Follow the monitoring steps for close contacts listed on the symptom monitoring form.   ? If you have additional questions, contact your local health department or call the epidemiologist on call at [phone] (available 24/7). ? This guidance is subject to change. For the most up-to-date guidance from [facility], please refer to their website: YouBlogs.pl

## 2019-05-05 NOTE — ED Triage Notes (Signed)
Pt presents for covid testing, no symptoms. 

## 2019-05-08 LAB — NOVEL CORONAVIRUS, NAA (HOSP ORDER, SEND-OUT TO REF LAB; TAT 18-24 HRS): SARS-CoV-2, NAA: NOT DETECTED
